# Patient Record
Sex: Female | Born: 1950 | Race: White | Hispanic: No | Marital: Married | Smoking: Current every day smoker
Health system: Southern US, Community
[De-identification: ages and names within clinical notes are randomized; demographics above are authoritative.]

## PROBLEM LIST (undated history)

## (undated) ENCOUNTER — Emergency Department: Admission: EM | Discharge status: Absent without leave | Payer: Medicare Other

## (undated) DIAGNOSIS — F419 Anxiety disorder, unspecified: Secondary | ICD-10-CM

## (undated) DIAGNOSIS — E7800 Pure hypercholesterolemia, unspecified: Secondary | ICD-10-CM

## (undated) HISTORY — PX: TONSILLECTOMY: SUR1361

## (undated) HISTORY — PX: CHOLECYSTECTOMY: SHX55

---

## 2012-09-24 ENCOUNTER — Ambulatory Visit: Payer: Self-pay | Admitting: Family Medicine

## 2012-11-09 IMAGING — MG MM MAMMOGRAM BILAT
6 series · 6 of 6 positions shown · non-contrast
Comparison: none

[R CC]
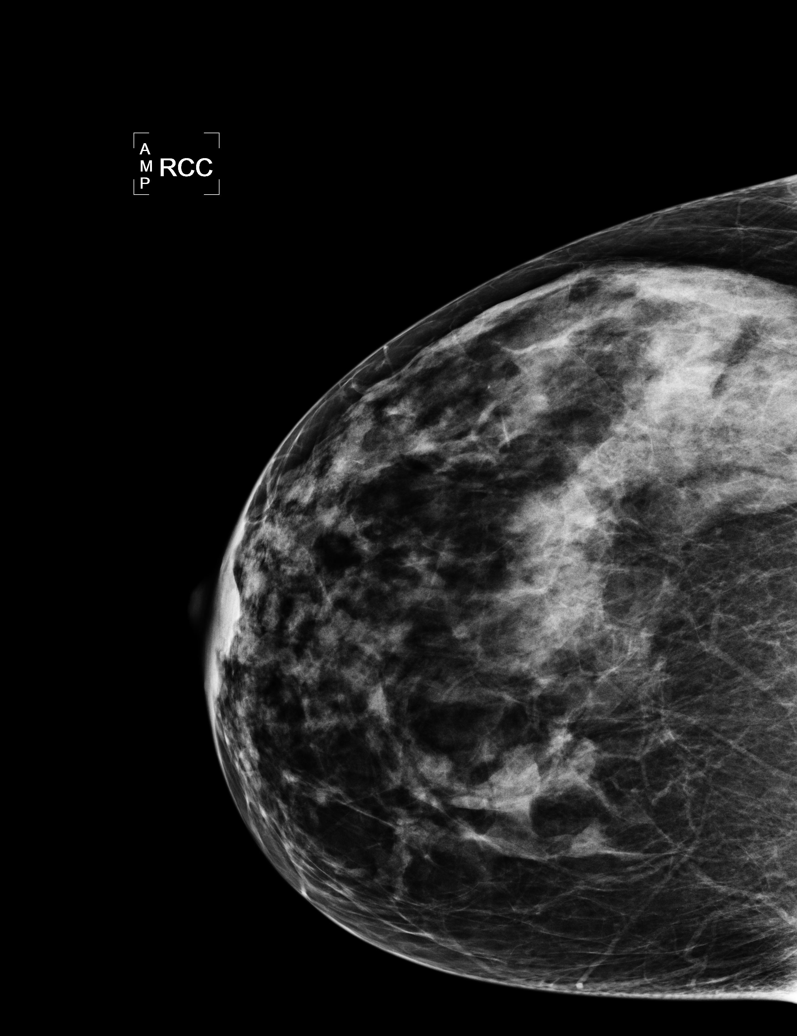

[L CC]
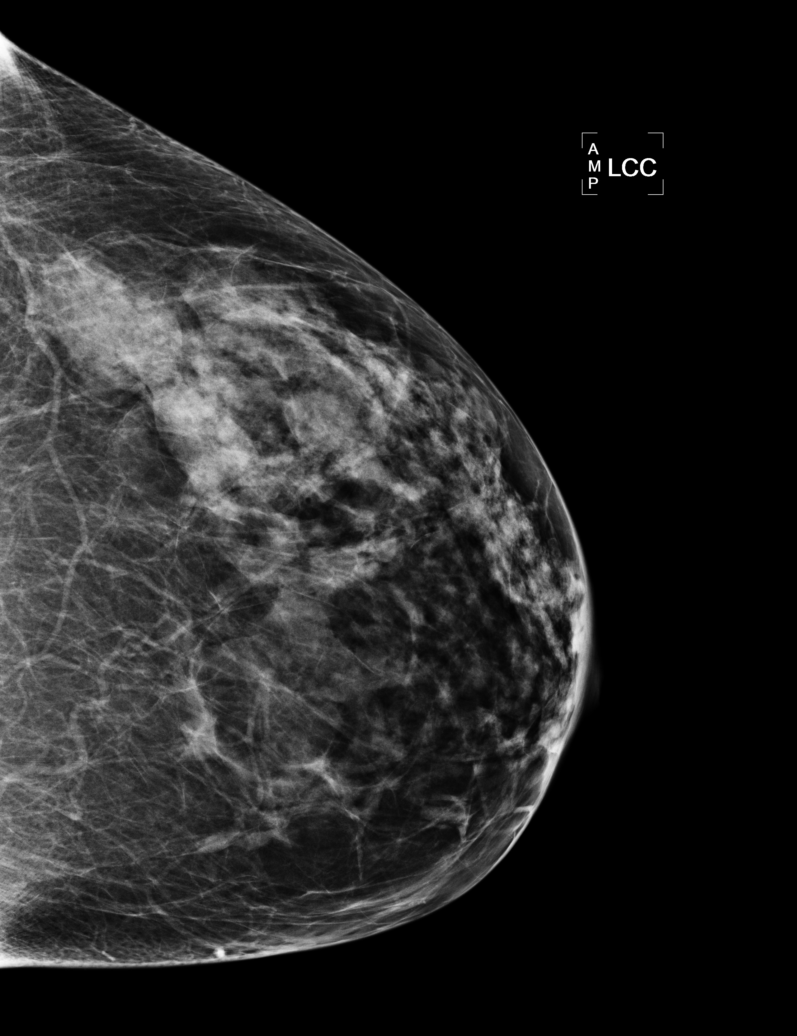

[L MLO]
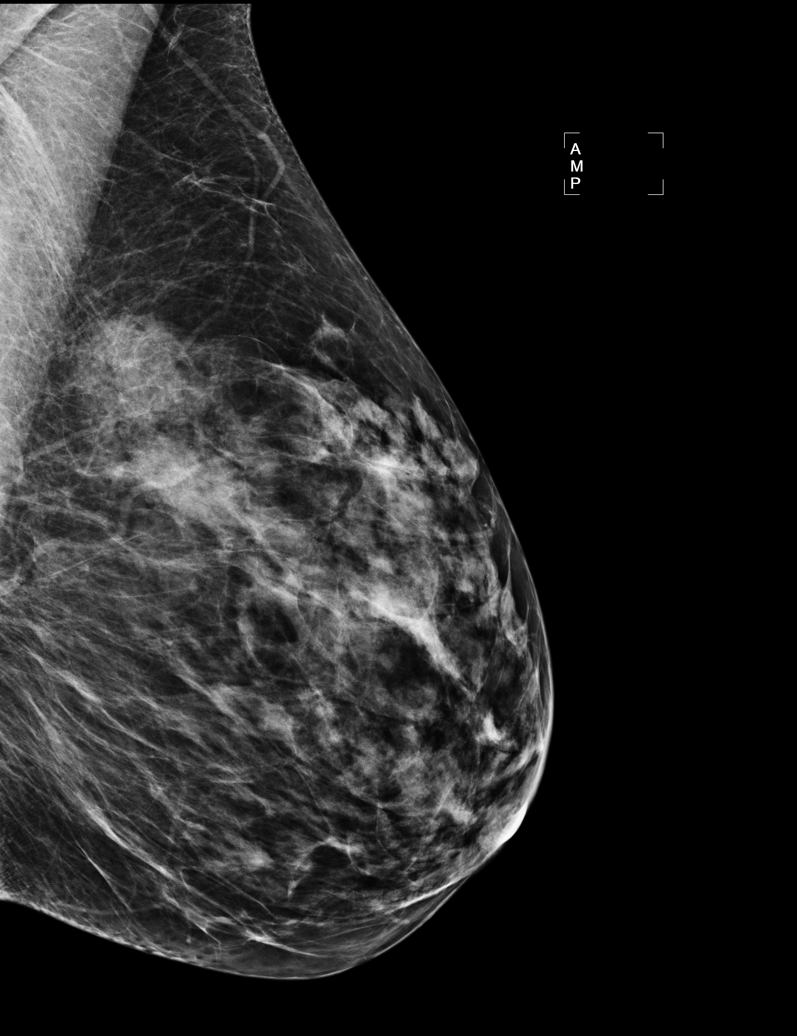

[R MLO]
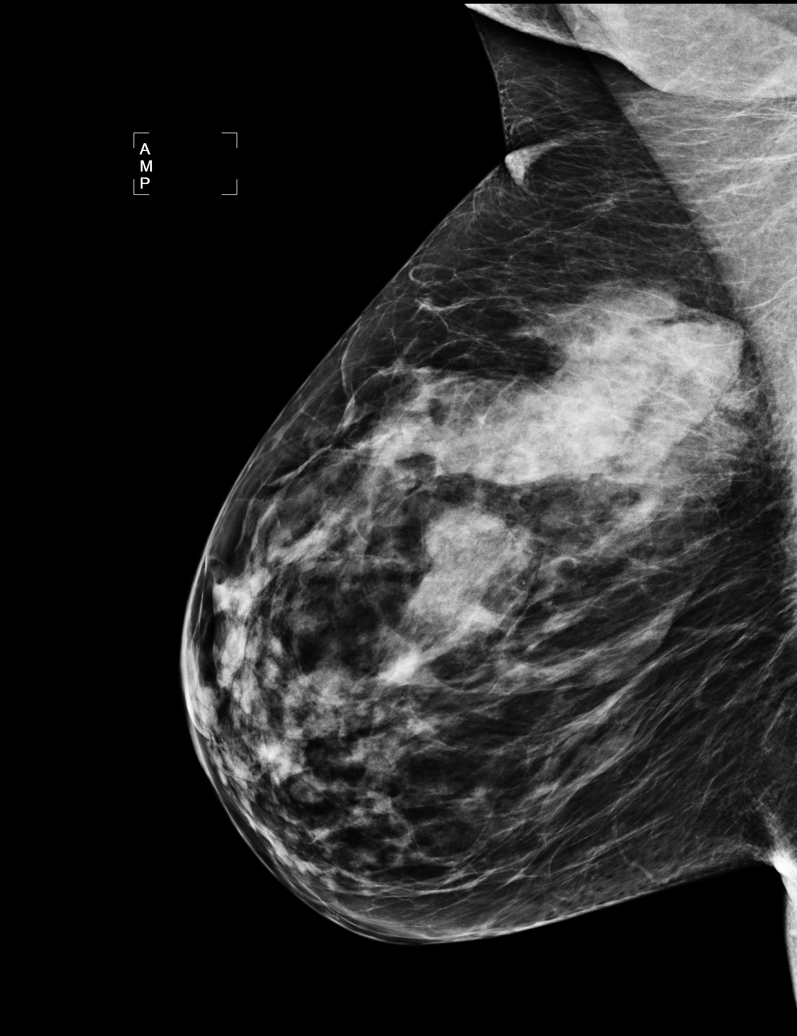

[L ML]
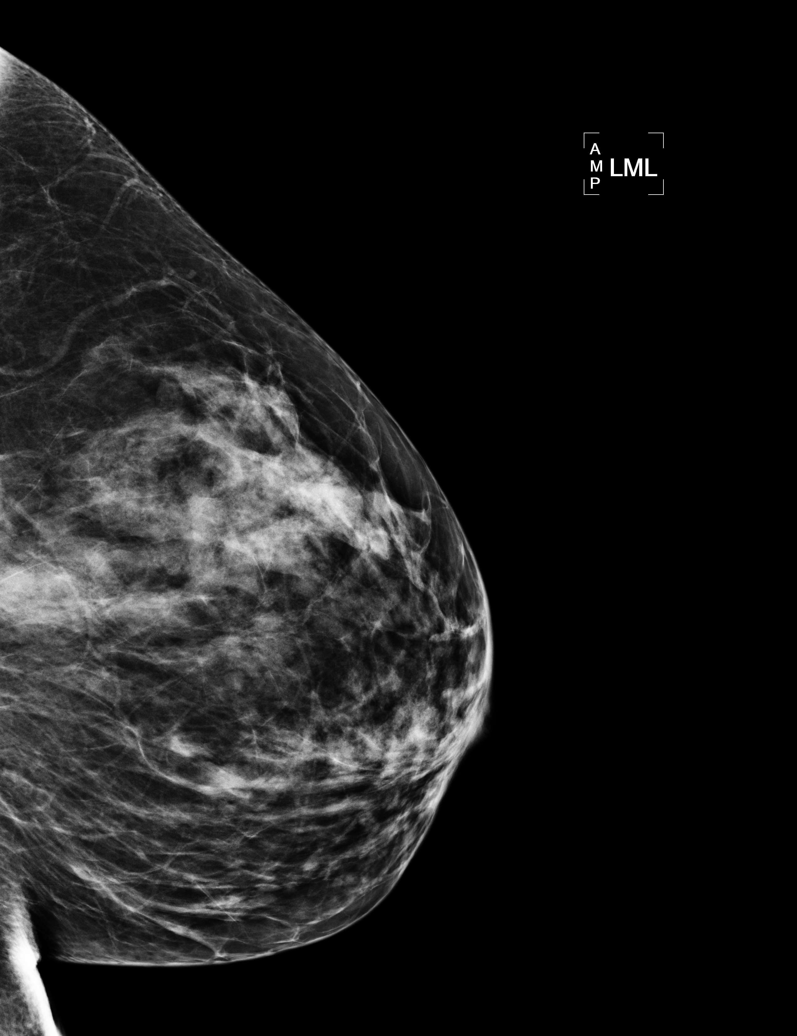

[R ML]
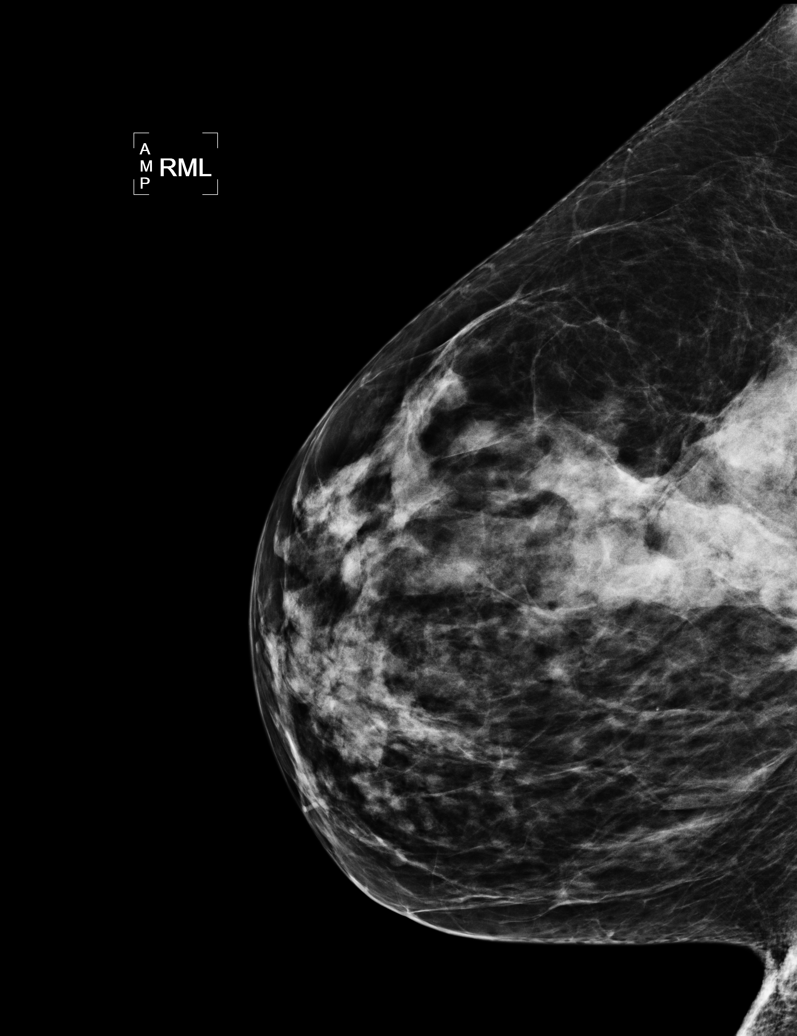

[6 of 6 positions shown; findings below may reference images not displayed]

Canned report from images found in remote index.

Refer to host system for actual result text.

## 2013-03-25 ENCOUNTER — Ambulatory Visit: Payer: Self-pay | Admitting: Gastroenterology

## 2013-03-28 LAB — PATHOLOGY REPORT

## 2013-11-26 ENCOUNTER — Ambulatory Visit: Payer: Self-pay | Admitting: Family Medicine

## 2013-12-02 ENCOUNTER — Ambulatory Visit: Payer: Self-pay | Admitting: Family Medicine

## 2014-09-02 ENCOUNTER — Ambulatory Visit
Admission: EM | Admit: 2014-09-02 | Discharge: 2014-09-02 | Disposition: A | Discharge status: Home / Self Care | Payer: Medicare Other | Attending: Emergency Medicine | Admitting: Emergency Medicine

## 2014-09-02 DIAGNOSIS — W57XXXA Bitten or stung by nonvenomous insect and other nonvenomous arthropods, initial encounter: Secondary | ICD-10-CM

## 2014-09-02 DIAGNOSIS — T148 Other injury of unspecified body region: Secondary | ICD-10-CM | POA: Diagnosis not present

## 2014-09-02 HISTORY — DX: Pure hypercholesterolemia, unspecified: E78.00

## 2014-09-02 HISTORY — DX: Anxiety disorder, unspecified: F41.9

## 2014-09-02 MED ORDER — SULFAMETHOXAZOLE-TRIMETHOPRIM 800-160 MG PO TABS
1.0000 | ORAL_TABLET | Freq: Two times a day (BID) | ORAL | Status: AC
Start: 2014-09-02 — End: ?

## 2014-09-02 MED ORDER — CLOBETASOL PROPIONATE 0.05 % EX CREA: 1.0000 "application " | TOPICAL_CREAM | Freq: Two times a day (BID) | CUTANEOUS | Status: AC

## 2014-09-02 NOTE — ED Provider Notes (Signed)
CSN: [id number]     Arrival date & time 09/02/14  1938 History   None    Chief Complaint  Patient presents with  . Insect Bite   (Consider location/radiation/quality/duration/timing/severity/associated sxs/prior Treatment) HPI   This a 64 year old female who presents with 2 bug bites on either hip. She states that when she went to bed yesterday she woke morning this tender swollen red and itchy areas on either side of her hips. There is a central area that appears to be a bite. A similar lesion on her left medial upper arm similar intensely itchy that improved spontaneously. His ears are very similar in appearance and very itchy. She has not any fever or chills or any discharge from the central course.  Past Medical History  Diagnosis Date  . Anxiety   . Hypercholesteremia    Past Surgical History  Procedure Laterality Date  . Cholecystectomy    . Tonsillectomy     Family History  Problem Relation Age of Onset  . Heart failure Mother   . Heart failure Father   . Cancer Sister    History  Substance Use Topics  . Smoking status: Current Every Day Smoker  . Smokeless tobacco: Not on file  . Alcohol Use: Yes     Comment: in moderation   OB History    No data available     Review of Systems  Skin: Positive for rash and wound.  All other systems reviewed and are negative.   Allergies  Gluten meal  Home Medications   Prior to Admission medications   Medication Sig Start Date End Date Taking? Authorizing Provider  citalopram (CELEXA) 20 MG tablet Take 20 mg by mouth daily.   Yes Historical Provider, MD  Multiple Vitamin (MULTIVITAMIN) capsule Take 1 capsule by mouth daily.   Yes Historical Provider, MD  simvastatin (ZOCOR) 20 MG tablet Take 20 mg by mouth daily.   Yes Historical Provider, MD  clobetasol cream (TEMOVATE) 0.05 % Apply 1 application topically 2 (two) times daily. 09/02/14   Lutricia Feil, PA-C  sulfamethoxazole-trimethoprim (BACTRIM DS,SEPTRA DS) 800-160  MG per tablet Take 1 tablet by mouth 2 (two) times daily. 09/02/14   Chrissie Noa Jazara Swiney, PA-C   BP 114/55 mmHg  Pulse 102  Temp(Src) 98.2 F (36.8 C) (Oral)  Resp 18  Ht 5' (1.524 m)  Wt 115 lb (52.164 kg)  BMI 22.46 kg/m2  SpO2 95% Physical Exam  Constitutional: She is oriented to person, place, and time. She appears well-developed and well-nourished.  HENT:  Head: Normocephalic and atraumatic.  Eyes: EOM are normal. Pupils are equal, round, and reactive to light.  Neurological: She is alert and oriented to person, place, and time.  Skin: Skin is warm and dry.  Examination of both hips on the lateral aspect shows the left have a central core with a surrounding cellulitis in a circular pattern approximate 5 cm in diameter. The skin is blanchable with a induration directly under the cord but not extensive at all. Examination of the right hip in addition to the above as is expanding area with erythema towards the symphysis pubis in a less circular pattern. Also excoriations present from her itching. No drainage.  Psychiatric: She has a normal mood and affect. Her behavior is normal. Judgment and thought content normal.  Nursing note and vitals reviewed.   ED Course  Procedures (including critical care time) Labs Review Labs Reviewed - No data to display  Imaging Review No results found.  MDM   1. Insect bites    New Prescriptions   CLOBETASOL CREAM (TEMOVATE) 0.05 %    Apply 1 application topically 2 (two) times daily.   SULFAMETHOXAZOLE-TRIMETHOPRIM (BACTRIM DS,SEPTRA DS) 800-160 MG PER TABLET    Take 1 tablet by mouth 2 (two) times daily.   Plan: 1. Diagnosis reviewed with patient 2. rx as per orders; risks, benefits, potential side effects reviewed with patient 3. Recommend supportive treatment with zyrtec /benadryl PRN. 4. F/u prn if symptoms worsen or don't improve    Lutricia FeilWilliam P Icesis Renn, PA-C 09/02/14 2040

## 2014-09-02 NOTE — Discharge Instructions (Signed)
Insect Bite Mosquitoes, flies, fleas, bedbugs, and many other insects can bite. Insect bites are different from insect stings. A sting is when venom is injected into the skin. Some insect bites can transmit infectious diseases. SYMPTOMS  Insect bites usually turn red, swell, and itch for 2 to 4 days. They often go away on their own. TREATMENT  Your caregiver may prescribe antibiotic medicines if a bacterial infection develops in the bite. HOME CARE INSTRUCTIONS  Do not scratch the bite area.  Keep the bite area clean and dry. Wash the bite area thoroughly with soap and water.  Put ice or cool compresses on the bite area.  Put ice in a plastic bag.  Place a towel between your skin and the bag.  Leave the ice on for 20 minutes, 4 times a day for the first 2 to 3 days, or as directed.  You may apply a baking soda paste, cortisone cream, or calamine lotion to the bite area as directed by your caregiver. This can help reduce itching and swelling.  Only take over-the-counter or prescription medicines as directed by your caregiver.  If you are given antibiotics, take them as directed. Finish them even if you start to feel better. You may need a tetanus shot if:  You cannot remember when you had your last tetanus shot.  You have never had a tetanus shot.  The injury broke your skin. If you get a tetanus shot, your arm may swell, get red, and feel warm to the touch. This is common and not a problem. If you need a tetanus shot and you choose not to have one, there is a rare chance of getting tetanus. Sickness from tetanus can be serious. SEEK IMMEDIATE MEDICAL CARE IF:   You have increased pain, redness, or swelling in the bite area.  You see a red line on the skin coming from the bite.  You have a fever.  You have joint pain.  You have a headache or neck pain.  You have unusual weakness.  You have a rash.  You have chest pain or shortness of breath.  You have abdominal pain,  nausea, or vomiting.  You feel unusually tired or sleepy. MAKE SURE YOU:   Understand these instructions.  Will watch your condition.  Will get help right away if you are not doing well or get worse. Document Released: 03/16/2004 Document Revised: 05/01/2011 Document Reviewed: 09/07/2010 [facility] Center[facility] Patient Information 2015 [place], [place]. This information is not intended to replace advice given to you by your health care provider. Make sure you discuss any questions you have with your health care provider.  DEET Insect Repellent  DEET is a commonly used insect repellent. DEET is effective against mosquitoes, ticks, and chiggers.DEET is not effective against stinging insects, such as bees and wasps. When mosquitoes or ticks are active, take the following precautions.  Use DEET according to the directions on the label.  Wear protective clothing if you are outside in an area where there are weeds, tall grass, or bushes. This includes long pants, socks, and loose-fitting, long-sleeved shirts. Consider spraying DEET on your clothing. Avoid being outdoors in the early evening. This is when mosquitoes are most active.  Products with a low concentration of DEET (10% to 20%) may be useful in areas with few insects. Higher concentrations of DEET may be needed in areas with many insects. Repellents used on children should not contain more than 30% DEET. Although higher concentrations of DEET (up to 95%) are  available for adults, they are not recommended for routine use. Concentrations higher than 50% do not provide additional protection. Depending on the concentration of DEET in a product, it can be effective for about 2 to 6 hours. °· When applying DEET to children, use the lowest concentration that is effective. Ten percent DEET will last approximately 2 to 3 hours, while 30% will last 4 to 5 hours. Do not use DEET on infants younger than 2 months old. Do not apply DEET more often than once a day to  children under the age of 2. °· Avoid prolonged or excessive use of DEET. Use it sparingly to cover exposed skin and clothing. Adverse reactions to DEET in the recommended concentrations are uncommon. However, skin irritation can occur in some people. °· Wash all treated skin and clothing with soap and water after returning indoors. °· Do not allow children to apply insect repellent themselves. °· Do not apply DEET near cuts or open wounds. You can apply DEET and sunscreen together. However, it is recommend that you apply the sunscreen first. °· Do not apply DEET to a child's hands or near a child's eyes and mouth. If DEET is accidentally sprayed in the eyes, wash the eyes out with large amounts of water. °· Store DEET out of the reach of children. °· Most authorities feel that it is safe to use DEET during pregnancy. However, pregnant women should only use insect repellents when they are in areas with a high risk of disease carried by insects (malaria, West Nile virus, encephalitis). °Document Released: 11/01/2000 Document Revised: 06/23/2013 Document Reviewed: 10/26/2010 °[employer]® Patient Information ©2015 [employer]. This information is not intended to replace advice given to you by your health care provider. Make sure you discuss any questions you have with your health care provider. ° °

## 2014-09-02 NOTE — ED Notes (Signed)
Woke yesterday morning with ? Insect bites RLQ and LLQ with redness both areas. Itching ++, denies pain

## 2015-09-08 ENCOUNTER — Other Ambulatory Visit
Admission: EM | Admit: 2015-09-08 | Discharge: 2015-09-08 | Disposition: A | Discharge status: Home / Self Care | Attending: Family Medicine | Admitting: Family Medicine

## 2015-09-08 NOTE — ED Notes (Signed)
Patient ambulatory to triage with steady gait, without difficulty or distress noted, in custody of [place] [employer] officer Jeri Lager  for forensic blood draw; pt A&Ox3, with no c/o voiced and denies need to see ED provider; pt voices good understanding of blood draw to be performed for forensic testing; using sealed kit provided by officer, tourniquet applied to left upper arm; left antecubital region prepped with betadine swab and allowed to dry completely; needle inserted and 2 grey top blood tubes collected; tourniquet removed, needle removed & intact, dressing applied; tubes labeled, given to officer and placed in sealed container using chain of custody; pt tolerated well and continues to deny c/o or need to see ED provider; pt d/c in police custody

## 2017-04-20 DEATH — deceased
